# Patient Record
Sex: Female | Born: 2000 | Race: Black or African American | Hispanic: No | Marital: Single | Smoking: Never smoker
Health system: Southern US, Community
[De-identification: ages and names within clinical notes are randomized; demographics above are authoritative.]

## PROBLEM LIST (undated history)

## (undated) ENCOUNTER — Emergency Department: Admission: EM | Payer: No Typology Code available for payment source

## (undated) DIAGNOSIS — F32A Depression, unspecified: Secondary | ICD-10-CM

## (undated) DIAGNOSIS — F319 Bipolar disorder, unspecified: Secondary | ICD-10-CM

## (undated) DIAGNOSIS — F329 Major depressive disorder, single episode, unspecified: Secondary | ICD-10-CM

---
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
## 2023-04-15 DIAGNOSIS — R0789 Other chest pain: Secondary | ICD-10-CM | POA: Diagnosis not present

## 2023-04-15 DIAGNOSIS — R058 Other specified cough: Secondary | ICD-10-CM | POA: Diagnosis not present

## 2023-04-15 DIAGNOSIS — M25561 Pain in right knee: Secondary | ICD-10-CM | POA: Diagnosis not present

## 2023-04-15 DIAGNOSIS — R079 Chest pain, unspecified: Secondary | ICD-10-CM | POA: Diagnosis not present

## 2023-06-02 DIAGNOSIS — F17290 Nicotine dependence, other tobacco product, uncomplicated: Secondary | ICD-10-CM | POA: Diagnosis not present

## 2023-06-02 DIAGNOSIS — R110 Nausea: Secondary | ICD-10-CM | POA: Diagnosis not present

## 2023-06-02 DIAGNOSIS — R1084 Generalized abdominal pain: Secondary | ICD-10-CM | POA: Diagnosis not present

## 2023-06-02 DIAGNOSIS — R112 Nausea with vomiting, unspecified: Secondary | ICD-10-CM | POA: Diagnosis not present

## 2023-06-02 DIAGNOSIS — Z59811 Housing instability, housed, with risk of homelessness: Secondary | ICD-10-CM | POA: Diagnosis not present

## 2023-06-02 DIAGNOSIS — I959 Hypotension, unspecified: Secondary | ICD-10-CM | POA: Diagnosis not present

## 2023-06-02 DIAGNOSIS — R42 Dizziness and giddiness: Secondary | ICD-10-CM | POA: Diagnosis not present

## 2024-01-02 DIAGNOSIS — Z113 Encounter for screening for infections with a predominantly sexual mode of transmission: Secondary | ICD-10-CM | POA: Diagnosis not present

## 2024-01-02 DIAGNOSIS — Z8759 Personal history of other complications of pregnancy, childbirth and the puerperium: Secondary | ICD-10-CM | POA: Diagnosis not present

## 2024-01-02 DIAGNOSIS — J4520 Mild intermittent asthma, uncomplicated: Secondary | ICD-10-CM | POA: Diagnosis not present

## 2024-01-02 DIAGNOSIS — Z7251 High risk heterosexual behavior: Secondary | ICD-10-CM | POA: Diagnosis not present

## 2024-01-02 DIAGNOSIS — I10 Essential (primary) hypertension: Secondary | ICD-10-CM | POA: Diagnosis not present

## 2024-01-30 DIAGNOSIS — R079 Chest pain, unspecified: Secondary | ICD-10-CM | POA: Diagnosis not present
